# Patient Record
Sex: Male | Born: 1990 | Race: White | Hispanic: No | Marital: Married | Smoking: Never smoker
Health system: Southern US, Community
[De-identification: ages and names within clinical notes are randomized; demographics above are authoritative.]

## PROBLEM LIST (undated history)

## (undated) DIAGNOSIS — F419 Anxiety disorder, unspecified: Secondary | ICD-10-CM

## (undated) DIAGNOSIS — M109 Gout, unspecified: Secondary | ICD-10-CM

## (undated) HISTORY — DX: Anxiety disorder, unspecified: F41.9

## (undated) HISTORY — DX: Gout, unspecified: M10.9

---

## 2014-08-22 DIAGNOSIS — M109 Gout, unspecified: Secondary | ICD-10-CM | POA: Insufficient documentation

## 2015-01-14 ENCOUNTER — Other Ambulatory Visit: Payer: Self-pay | Admitting: Unknown Physician Specialty

## 2015-01-14 DIAGNOSIS — G4482 Headache associated with sexual activity: Secondary | ICD-10-CM

## 2015-01-23 ENCOUNTER — Ambulatory Visit
Admission: RE | Admit: 2015-01-23 | Discharge: 2015-01-23 | Disposition: A | Discharge status: Home / Self Care | Payer: BLUE CROSS/BLUE SHIELD | Source: Ambulatory Visit | Attending: Unknown Physician Specialty | Admitting: Unknown Physician Specialty

## 2015-01-23 DIAGNOSIS — G4482 Headache associated with sexual activity: Secondary | ICD-10-CM

## 2015-01-23 DIAGNOSIS — R51 Headache: Secondary | ICD-10-CM | POA: Diagnosis present

## 2015-01-23 DIAGNOSIS — G935 Compression of brain: Secondary | ICD-10-CM | POA: Insufficient documentation

## 2015-01-29 ENCOUNTER — Other Ambulatory Visit: Payer: Self-pay | Admitting: Unknown Physician Specialty

## 2015-01-29 DIAGNOSIS — G935 Compression of brain: Secondary | ICD-10-CM

## 2015-02-02 ENCOUNTER — Ambulatory Visit: Admission: RE | Admit: 2015-02-02 | Payer: BLUE CROSS/BLUE SHIELD | Source: Ambulatory Visit

## 2015-02-08 ENCOUNTER — Ambulatory Visit: Payer: BLUE CROSS/BLUE SHIELD

## 2015-02-14 ENCOUNTER — Ambulatory Visit
Admission: RE | Admit: 2015-02-14 | Discharge: 2015-02-14 | Disposition: A | Discharge status: Home / Self Care | Payer: BLUE CROSS/BLUE SHIELD | Source: Ambulatory Visit | Attending: Unknown Physician Specialty | Admitting: Unknown Physician Specialty

## 2015-02-14 DIAGNOSIS — M47892 Other spondylosis, cervical region: Secondary | ICD-10-CM | POA: Diagnosis not present

## 2015-02-14 DIAGNOSIS — G935 Compression of brain: Secondary | ICD-10-CM | POA: Diagnosis present

## 2015-02-27 ENCOUNTER — Other Ambulatory Visit: Payer: Self-pay | Admitting: Unknown Physician Specialty

## 2015-05-05 ENCOUNTER — Other Ambulatory Visit: Payer: Self-pay | Admitting: Unknown Physician Specialty

## 2015-07-07 ENCOUNTER — Other Ambulatory Visit: Payer: Self-pay | Admitting: Unknown Physician Specialty

## 2015-07-08 NOTE — Telephone Encounter (Signed)
Needs an appointment. Will get him enough medicine to make it to appointment when it's booked.   

## 2015-07-08 NOTE — Telephone Encounter (Signed)
Called and scheduled patient an appointment for 07/22/15.

## 2015-07-22 ENCOUNTER — Encounter: Payer: Self-pay | Admitting: Unknown Physician Specialty

## 2015-07-22 ENCOUNTER — Ambulatory Visit (INDEPENDENT_AMBULATORY_CARE_PROVIDER_SITE_OTHER): Payer: BLUE CROSS/BLUE SHIELD | Admitting: Unknown Physician Specialty

## 2015-07-22 VITALS — BP 142/86 | HR 63 | Temp 98.5°F | Ht 69.5 in | Wt 269.8 lb

## 2015-07-22 DIAGNOSIS — F419 Anxiety disorder, unspecified: Secondary | ICD-10-CM

## 2015-07-22 DIAGNOSIS — F322 Major depressive disorder, single episode, severe without psychotic features: Secondary | ICD-10-CM | POA: Diagnosis not present

## 2015-07-22 DIAGNOSIS — G935 Compression of brain: Secondary | ICD-10-CM | POA: Insufficient documentation

## 2015-07-22 NOTE — Assessment & Plan Note (Addendum)
Followed by neurosurgery.  MRI due this summer of C spine

## 2015-07-22 NOTE — Progress Notes (Signed)
BP 142/86 mmHg  Pulse 63  Temp(Src) 98.5 F (36.9 C)  Ht 5' 9.5" (1.765 m)  Wt 269 lb 12.8 oz (122.38 kg)  BMI 39.28 kg/m2  SpO2 100%   Subjective:    Patient ID: Kenneth Tate, male    DOB: [birth year], 24 y.o.   MRN: [id number]  HPI: Kenneth Tate is a 24 y.o. male  Chief Complaint  Patient presents with  . Medication Refill    pt states he needs refill on sertraline  . Anxiety   Depression/anxiety Stable.  Just got back from vacation.  Rarely takes Xanax.  He is considering cutting back Zoloft to 25 mg.   GAD 7 : Generalized Anxiety Score 07/22/2015  Nervous, Anxious, on Edge 0  Control/stop worrying 0  Worry too much - different things 0  Trouble relaxing 0  Restless 0  Easily annoyed or irritable 1  Afraid - awful might happen 1  Total GAD 7 Score 2   Depression screen PHQ 2/9 07/22/2015  Decreased Interest 0  Down, Depressed, Hopeless 1  PHQ - 2 Score 1    Relevant past medical, surgical, family and social history reviewed and updated as indicated. Interim medical history since our last visit reviewed. Allergies and medications reviewed and updated.  Review of Systems  Constitutional: Negative.   HENT: Negative.   Eyes: Negative.   Respiratory: Negative.   Cardiovascular: Negative.   Gastrointestinal: Negative.   Endocrine: Negative.   Genitourinary: Negative.   Skin: Negative.   Allergic/Immunologic: Negative.   Neurological: Negative.   Hematological: Negative.   Psychiatric/Behavioral: Negative.     Per HPI unless specifically indicated above     Objective:    BP 142/86 mmHg  Pulse 63  Temp(Src) 98.5 F (36.9 C)  Ht 5' 9.5" (1.765 m)  Wt 269 lb 12.8 oz (122.38 kg)  BMI 39.28 kg/m2  SpO2 100%  Wt Readings from Last 3 Encounters:  07/22/15 269 lb 12.8 oz (122.38 kg)  01/28/15 236 lb (107.049 kg)  02/14/15 236 lb (107.049 kg)    Physical Exam  Constitutional: He is oriented to person, place, and time. He appears  well-developed and well-nourished. No distress.  HENT:  Head: Normocephalic and atraumatic.  Eyes: Conjunctivae and lids are normal. Right eye exhibits no discharge. Left eye exhibits no discharge. No scleral icterus.  Cardiovascular: Normal rate, regular rhythm and normal heart sounds.   Pulmonary/Chest: Effort normal and breath sounds normal. No respiratory distress.  Abdominal: Normal appearance and bowel sounds are normal. He exhibits no distension. There is no splenomegaly or hepatomegaly. There is no tenderness.  Musculoskeletal: Normal range of motion.  Neurological: He is alert and oriented to person, place, and time.  Skin: Skin is intact. No rash noted. No pallor.  Psychiatric: He has a normal mood and affect. His behavior is normal. Judgment and thought content normal.    No results found for this or any previous visit.    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  MRI due this summer of C spine      Acute anxiety   Severe major depression (HCC)    Stable.  Cut back to 25 mg.  Pt has my permission to cut in half and increase back to 50 mg as needed.           Counseled  about borderline high BP.    Follow up plan: Return in about 6 months (around 01/19/2016) for physical.

## 2015-07-22 NOTE — Assessment & Plan Note (Signed)
Stable.  Cut back to 25 mg.  Pt has my permission to cut in half and increase back to 50 mg as needed.

## 2015-09-06 ENCOUNTER — Other Ambulatory Visit: Payer: Self-pay | Admitting: Family Medicine

## 2015-09-11 ENCOUNTER — Ambulatory Visit (INDEPENDENT_AMBULATORY_CARE_PROVIDER_SITE_OTHER): Payer: BLUE CROSS/BLUE SHIELD | Admitting: Family Medicine

## 2015-09-11 ENCOUNTER — Encounter: Payer: Self-pay | Admitting: Family Medicine

## 2015-09-11 VITALS — BP 139/89 | HR 81 | Temp 99.6°F | Ht 69.9 in | Wt 268.4 lb

## 2015-09-11 DIAGNOSIS — R52 Pain, unspecified: Secondary | ICD-10-CM

## 2015-09-11 DIAGNOSIS — J40 Bronchitis, not specified as acute or chronic: Secondary | ICD-10-CM

## 2015-09-11 LAB — INFLUENZA A AND B
INFLUENZA B AG, EIA: NEGATIVE
Influenza A Ag, EIA: NEGATIVE

## 2015-09-11 LAB — PLEASE NOTE:

## 2015-09-11 MED ORDER — ALBUTEROL SULFATE HFA 108 (90 BASE) MCG/ACT IN AERS: 2.0000 | INHALATION_SPRAY | Freq: Four times a day (QID) | RESPIRATORY_TRACT | Status: DC | PRN

## 2015-09-11 MED ORDER — BENZONATATE 200 MG PO CAPS: 200.0000 mg | ORAL_CAPSULE | Freq: Three times a day (TID) | ORAL | Status: DC | PRN

## 2015-09-11 MED ORDER — AZITHROMYCIN 250 MG PO TABS: ORAL_TABLET | ORAL | Status: DC

## 2015-09-11 MED ORDER — HYDROCOD POLST-CPM POLST ER 10-8 MG/5ML PO SUER: 5.0000 mL | Freq: Every evening | ORAL | Status: DC | PRN

## 2015-09-11 NOTE — Patient Instructions (Signed)

## 2015-09-11 NOTE — Progress Notes (Signed)
BP 139/89 mmHg  Pulse 81  Temp(Src) 99.6 F (37.6 C)  Ht 5' 9.9" (1.775 m)  Wt 268 lb 6.4 oz (121.745 kg)  BMI 38.64 kg/m2  SpO2 99%   Subjective:    Patient ID: Kenneth Tate, male    DOB: 12/02/1990, 24 y.o.   MRN: [id number]  HPI: Kenneth Tate is a 24 y.o. male  Chief Complaint  Patient presents with  . URI    pt states he has some chest congestion, body aches (head feels heavy), cough, chills, and states fever spikes at night (sweats at night). States symptoms started 09/07/15.   UPPER RESPIRATORY TRACT INFECTION- spot on his mouth that is inflammed that started before the URI Duration: 3-4 days Worst symptom: Headache Fever: yes 100-101 Cough: yes Shortness of breath: yes Wheezing: yes Chest pain: no Chest tightness: yes Chest congestion: no Nasal congestion: no Runny nose: no Post nasal drip: yes Sneezing: no Sore throat: yes Swollen glands: no Sinus pressure: yes Headache: yes Face pain: yes Toothache: yes Ear pain: yes bilateral Ear pressure: yes bilateral Eyes red/itching:no Eye drainage/crusting: no  Vomiting: no Rash: no Fatigue: yes Sick contacts: yes Strep contacts: no  Context: stable Recurrent sinusitis: no Relief with OTC cold/cough medications: no  Treatments attempted: ibuprofen, cold/sinus and cough syrup    Relevant past medical, surgical, family and social history reviewed and updated as indicated. Interim medical history since our last visit reviewed. Allergies and medications reviewed and updated.  Review of Systems  Constitutional: Negative.   HENT: Negative.   Respiratory: Negative.   Cardiovascular: Negative.   Psychiatric/Behavioral: Negative.     Per HPI unless specifically indicated above     Objective:    BP 139/89 mmHg  Pulse 81  Temp(Src) 99.6 F (37.6 C)  Ht 5' 9.9" (1.775 m)  Wt 268 lb 6.4 oz (121.745 kg)  BMI 38.64 kg/m2  SpO2 99%  Wt Readings from Last 3 Encounters:  09/11/15 268 lb 6.4 oz  (121.745 kg)  07/22/15 269 lb 12.8 oz (122.38 kg)  01/28/15 236 lb (107.049 kg)    Physical Exam  Constitutional: He is oriented to person, place, and time. He appears well-developed and well-nourished. No distress.  HENT:  Head: Normocephalic and atraumatic.  Right Ear: Hearing normal.  Left Ear: Hearing normal.  Nose: Nose normal.  Eyes: Conjunctivae and lids are normal. Right eye exhibits no discharge. Left eye exhibits no discharge. No scleral icterus.  Cardiovascular: Normal rate, regular rhythm, normal heart sounds and intact distal pulses.  Exam reveals no gallop and no friction rub.   No murmur heard. Pulmonary/Chest: Effort normal. No respiratory distress. He has wheezes. He has no rales. He exhibits no tenderness.  Musculoskeletal: Normal range of motion.  Neurological: He is alert and oriented to person, place, and time.  Skin: Skin is warm, dry and intact. No rash noted. No erythema. No pallor.  Psychiatric: He has a normal mood and affect. His speech is normal and behavior is normal. Judgment and thought content normal. Cognition and memory are normal.  Nursing note and vitals reviewed.   No results found for this or any previous visit.    Assessment & Plan:   Problem List Items Addressed This Visit    None    Visit Diagnoses    Bronchitis    -  Primary    Will treat with albuterol and z-pack. Tussinoex and tessalon perles for cough. Return for lung recheck in 2 weeks. Rest and fluids.  Body aches        Checking for flu- negative.     Relevant Orders    Influenza a and b        Follow up plan: Return in about 2 weeks (around 09/25/2015) for Lung recheck.

## 2015-11-07 ENCOUNTER — Ambulatory Visit (INDEPENDENT_AMBULATORY_CARE_PROVIDER_SITE_OTHER): Payer: BLUE CROSS/BLUE SHIELD | Admitting: Family Medicine

## 2015-11-07 ENCOUNTER — Other Ambulatory Visit: Payer: Self-pay | Admitting: Family Medicine

## 2015-11-07 ENCOUNTER — Encounter: Payer: Self-pay | Admitting: Family Medicine

## 2015-11-07 VITALS — BP 138/89 | HR 69 | Temp 98.4°F | Ht 69.3 in | Wt 266.0 lb

## 2015-11-07 DIAGNOSIS — K648 Other hemorrhoids: Secondary | ICD-10-CM | POA: Diagnosis not present

## 2015-11-07 DIAGNOSIS — K644 Residual hemorrhoidal skin tags: Secondary | ICD-10-CM

## 2015-11-07 DIAGNOSIS — K921 Melena: Secondary | ICD-10-CM

## 2015-11-07 LAB — CBC WITH DIFFERENTIAL/PLATELET
HEMATOCRIT: 42 % (ref 37.5–51.0)
Hemoglobin: 14.7 g/dL (ref 12.6–17.7)
LYMPHS: 33 %
Lymphocytes Absolute: 2.5 10*3/uL (ref 0.7–3.1)
MCH: 30.6 pg (ref 26.6–33.0)
MCHC: 35 g/dL (ref 31.5–35.7)
MCV: 87 fL (ref 79–97)
MID (Absolute): 0.3 10*3/uL (ref 0.1–1.6)
MID: 4 %
NEUTROS PCT: 63 %
Neutrophils Absolute: 5 10*3/uL (ref 1.4–7.0)
PLATELETS: 324 10*3/uL (ref 150–379)
RBC: 4.81 x10E6/uL (ref 4.14–5.80)
RDW: 12.7 % (ref 12.3–15.4)
WBC: 7.8 10*3/uL (ref 3.4–10.8)

## 2015-11-07 MED ORDER — HYDROCORTISONE ACETATE 25 MG RE SUPP: 25.0000 mg | Freq: Two times a day (BID) | RECTAL | Status: DC

## 2015-11-07 NOTE — Progress Notes (Signed)
BP 138/89 mmHg  Pulse 69  Temp(Src) 98.4 F (36.9 C)  Ht 5' 9.3" (1.76 m)  Wt 266 lb (120.657 kg)  BMI 38.95 kg/m2  SpO2 99%   Subjective:    Patient ID: Kenneth Tate, male    DOB: Sep 24, 1990, 25 y.o.   MRN: [id number]  HPI: Kenneth Tate is a 25 y.o. male  Chief Complaint  Patient presents with  . Rectal Bleeding    Patient states that he has bleeding after exercise. Light red, itching and burning.   RECTAL BLEEDING- only after he exercises. Has some blood in his underwear, never with a bowel movement. With sweating and exercise Duration: month Bright red rectal bleeding:  yes Amount of blood: spotting Frequency: once a week Melena:  no Spotting on toilet tissue:  yes Anal fullness:  no Perianal pain:  yes Severity: mild Perianal irritation/itching:  yes Constipation:  no Chronic straining/valsava:  no Anal trauma/intercourse:  no Hemorrhoids:  no Previous colonoscopy:  no  Relevant past medical, surgical, family and social history reviewed and updated as indicated. Interim medical history since our last visit reviewed. Allergies and medications reviewed and updated.  Review of Systems  Constitutional: Negative.   Respiratory: Negative.   Cardiovascular: Negative.   Gastrointestinal: Negative.   Psychiatric/Behavioral: Negative for suicidal ideas, hallucinations, behavioral problems, confusion, sleep disturbance, self-injury, dysphoric mood, decreased concentration and agitation. The patient is nervous/anxious. The patient is not hyperactive.     Per HPI unless specifically indicated above     Objective:    BP 138/89 mmHg  Pulse 69  Temp(Src) 98.4 F (36.9 C)  Ht 5' 9.3" (1.76 m)  Wt 266 lb (120.657 kg)  BMI 38.95 kg/m2  SpO2 99%  Wt Readings from Last 3 Encounters:  11/07/15 266 lb (120.657 kg)  09/11/15 268 lb 6.4 oz (121.745 kg)  07/22/15 269 lb 12.8 oz (122.38 kg)    Physical Exam  Constitutional: He is oriented to person, place, and  time. He appears well-developed and well-nourished. No distress.  HENT:  Head: Normocephalic and atraumatic.  Right Ear: Hearing normal.  Left Ear: Hearing normal.  Nose: Nose normal.  Eyes: Conjunctivae and lids are normal. Right eye exhibits no discharge. Left eye exhibits no discharge. No scleral icterus.  Pulmonary/Chest: Effort normal. No respiratory distress.  Genitourinary: Rectal exam shows external hemorrhoid.     Musculoskeletal: Normal range of motion.  Neurological: He is alert and oriented to person, place, and time.  Skin: Skin is intact. No rash noted.  Psychiatric: He has a normal mood and affect. His speech is normal and behavior is normal. Judgment and thought content normal. Cognition and memory are normal.    Results for orders placed or performed in visit on 09/11/15  Influenza a and b  Result Value Ref Range   Influenza A Ag, EIA Negative Negative   Influenza B Ag, EIA Negative Negative   Influenza Comment See note   Please note:  Result Value Ref Range   Please note: Comment       Assessment & Plan:   Problem List Items Addressed This Visit    None    Visit Diagnoses    External hemorrhoid    -  Primary    Will treat with suppository, information given. Continue to monitor. Call with any problems.    Blood in stool        Normal CBC today. Likely due to hemorrhoid.        Follow up  plan: Return if symptoms worsen or fail to improve.

## 2015-11-07 NOTE — Patient Instructions (Signed)

## 2015-12-04 ENCOUNTER — Other Ambulatory Visit: Payer: Self-pay | Admitting: Family Medicine

## 2016-01-20 ENCOUNTER — Ambulatory Visit (INDEPENDENT_AMBULATORY_CARE_PROVIDER_SITE_OTHER): Payer: BLUE CROSS/BLUE SHIELD | Admitting: Family Medicine

## 2016-01-20 ENCOUNTER — Encounter: Payer: Self-pay | Admitting: Family Medicine

## 2016-01-20 VITALS — BP 143/77 | HR 59 | Temp 98.2°F

## 2016-01-20 DIAGNOSIS — F322 Major depressive disorder, single episode, severe without psychotic features: Secondary | ICD-10-CM | POA: Diagnosis not present

## 2016-01-20 DIAGNOSIS — Z0000 Encounter for general adult medical examination without abnormal findings: Secondary | ICD-10-CM

## 2016-01-20 LAB — URINALYSIS, ROUTINE W REFLEX MICROSCOPIC
Bilirubin, UA: NEGATIVE
Glucose, UA: NEGATIVE
KETONES UA: NEGATIVE
LEUKOCYTES UA: NEGATIVE
NITRITE UA: NEGATIVE
PROTEIN UA: NEGATIVE
RBC UA: NEGATIVE
Urobilinogen, Ur: 0.2 mg/dL (ref 0.2–1.0)
pH, UA: 7 (ref 5.0–7.5)

## 2016-01-20 MED ORDER — SERTRALINE HCL 50 MG PO TABS: 50.0000 mg | ORAL_TABLET | Freq: Every day | ORAL | Status: DC

## 2016-01-20 NOTE — Addendum Note (Signed)
Addended by: Bennetta LaosWILSON, Enjoli Tidd H on: 01/20/2016 10:24 AM   Modules accepted: Kipp BroodSmartSet

## 2016-01-20 NOTE — Assessment & Plan Note (Signed)
Panic stable wants to try and stop zoloft which is OK If needs more xanax will consider other meds

## 2016-01-20 NOTE — Progress Notes (Signed)
BP 143/77 mmHg  Pulse 59  Temp(Src) 98.2 F (36.8 C)  SpO2 99%   Subjective:    Patient ID: Kenneth Tate, male    DOB: October 24, 1990, 25 y.o.   MRN: [id number]  HPI: Kenneth SilviusZachary J Grindstaff is a 25 y.o. male  Chief Complaint  Patient presents with  . Annual Exam   Patient all in all doing well wakes up sometimes with hands numb shakes his hands is able to go right back to sleep with no further hand numbness. Is only when he wakes up in the morning doesn't bother  at night sleeping. 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  Relevant past medical, surgical, family and social history reviewed and updated as indicated. Interim medical history since our last visit reviewed. Allergies and medications reviewed and updated.  Review of Systems  Constitutional: Negative.   HENT: Negative.   Eyes: Negative.   Respiratory: Negative.   Cardiovascular: Negative.   Gastrointestinal: Negative.   Endocrine: Negative.   Genitourinary: Negative.   Musculoskeletal: Negative.   Skin: Negative.   Allergic/Immunologic: Negative.   Neurological: Negative.   Hematological: Negative.   Psychiatric/Behavioral: Negative.     Per HPI unless specifically indicated above     Objective:    BP 143/77 mmHg  Pulse 59  Temp(Src) 98.2 F (36.8 C)  SpO2 99%  Wt Readings from Last 3 Encounters:  11/07/15 266 lb (120.657 kg)  09/11/15 268 lb 6.4 oz (121.745 kg)  07/22/15 269 lb 12.8 oz (122.38 kg)    Physical Exam  Constitutional: He is oriented to person, place, and time. He appears well-developed and well-nourished.  HENT:  Head: Normocephalic.  Right Ear: External ear normal.  Left Ear: External ear normal.  Nose: Nose normal.  Eyes: Conjunctivae and EOM are normal. Pupils are equal, round, and reactive to light.  Neck: Normal range of  motion. Neck supple. No thyromegaly present.  Cardiovascular: Normal rate, regular rhythm, normal heart sounds and intact distal pulses.   Pulmonary/Chest: Effort normal and breath sounds normal.  Abdominal: Soft. Bowel sounds are normal. There is no splenomegaly or hepatomegaly.  Genitourinary: Penis normal.  Musculoskeletal: Normal range of motion.  Lymphadenopathy:    He has no cervical adenopathy.  Neurological: He is alert and oriented to person, place, and time. He has normal reflexes.  Skin: Skin is warm and dry.  Psychiatric: He has a normal mood and affect. His behavior is normal. Judgment and thought content normal.    Results for orders placed or performed in visit on 11/07/15  CBC With Differential/Platelet  Result Value Ref Range   WBC 7.8 3.4 - 10.8 x10E3/uL   RBC 4.81 4.14 - 5.80 x10E6/uL   Hemoglobin 14.7 12.6 - 17.7 g/dL   Hematocrit 47.442.0 25.937.5 - 51.0 %   MCV 87 79 - 97 fL   MCH 30.6 26.6 - 33.0 pg   MCHC 35.0 31.5 - 35.7 g/dL   RDW 56.312.7 87.512.3 - 64.315.4 %   Platelets 324 150 - 379 x10E3/uL   Neutrophils 63 %   Lymphs 33 %   MID 4 %   Neutrophils Absolute 5.0 1.4 - 7.0 x10E3/uL   Lymphocytes Absolute 2.5 0.7 - 3.1 x10E3/uL   MID (Absolute) 0.3 0.1 - 1.6 X10E3/uL      Assessment & Plan:   Problem  List Items Addressed This Visit      Other   Severe major depression (HCC)    Panic stable wants to try and stop zoloft which is OK If needs more xanax will consider other meds      Relevant Medications   sertraline (ZOLOFT) 50 MG tablet    Other Visit Diagnoses    Routine general medical examination at a health care facility    -  Primary    Relevant Orders    CBC with Differential/Platelet    Comprehensive metabolic panel    Lipid Panel w/o Chol/HDL Ratio    TSH    Urinalysis, Routine w reflex microscopic (not at [facility])      For elevated blood pressure today patient will work on diet exercise nutrition weight loss Checking blood pressure if not coming down  we will reevaluate   Follow up plan: Return in about 6 months (around 07/22/2016) for Med check and blood pressure check.

## 2016-01-21 ENCOUNTER — Encounter: Payer: Self-pay | Admitting: Family Medicine

## 2016-01-21 LAB — COMPREHENSIVE METABOLIC PANEL
A/G RATIO: 1.5 (ref 1.2–2.2)
ALK PHOS: 68 IU/L (ref 39–117)
ALT: 15 IU/L (ref 0–44)
AST: 22 IU/L (ref 0–40)
Albumin: 4.5 g/dL (ref 3.5–5.5)
BUN / CREAT RATIO: 10 (ref 9–20)
BUN: 9 mg/dL (ref 6–20)
Bilirubin Total: 0.6 mg/dL (ref 0.0–1.2)
CALCIUM: 9.5 mg/dL (ref 8.7–10.2)
CHLORIDE: 101 mmol/L (ref 96–106)
CO2: 25 mmol/L (ref 18–29)
CREATININE: 0.87 mg/dL (ref 0.76–1.27)
GFR calc Af Amer: 137 mL/min/{1.73_m2} (ref >59)
GFR calc non Af Amer: 118 mL/min/{1.73_m2} (ref >59)
GLOBULIN, TOTAL: 3 g/dL (ref 1.5–4.5)
Glucose: 98 mg/dL (ref 65–99)
POTASSIUM: 4.8 mmol/L (ref 3.5–5.2)
SODIUM: 141 mmol/L (ref 134–144)
Total Protein: 7.5 g/dL (ref 6.0–8.5)

## 2016-01-21 LAB — CBC WITH DIFFERENTIAL/PLATELET
Basophils Absolute: 0 10*3/uL (ref 0.0–0.2)
Basos: 0 %
EOS (ABSOLUTE): 0.1 10*3/uL (ref 0.0–0.4)
Eos: 2 %
Hematocrit: 43.4 % (ref 37.5–51.0)
Hemoglobin: 14.5 g/dL (ref 12.6–17.7)
IMMATURE GRANULOCYTES: 0 %
Immature Grans (Abs): 0 10*3/uL (ref 0.0–0.1)
LYMPHS ABS: 2 10*3/uL (ref 0.7–3.1)
Lymphs: 26 %
MCH: 29.9 pg (ref 26.6–33.0)
MCHC: 33.4 g/dL (ref 31.5–35.7)
MCV: 90 fL (ref 79–97)
MONOS ABS: 0.5 10*3/uL (ref 0.1–0.9)
Monocytes: 7 %
NEUTROS PCT: 65 %
Neutrophils Absolute: 5 10*3/uL (ref 1.4–7.0)
PLATELETS: 302 10*3/uL (ref 150–379)
RBC: 4.85 x10E6/uL (ref 4.14–5.80)
RDW: 13.1 % (ref 12.3–15.4)
WBC: 7.6 10*3/uL (ref 3.4–10.8)

## 2016-01-21 LAB — LIPID PANEL W/O CHOL/HDL RATIO
CHOLESTEROL TOTAL: 228 mg/dL — AB (ref 100–199)
HDL: 43 mg/dL (ref >39)
LDL CALC: 155 mg/dL — AB (ref 0–99)
Triglycerides: 149 mg/dL (ref 0–149)
VLDL Cholesterol Cal: 30 mg/dL (ref 5–40)

## 2016-01-21 LAB — TSH: TSH: 1.12 u[IU]/mL (ref 0.450–4.500)

## 2016-03-30 ENCOUNTER — Other Ambulatory Visit: Payer: Self-pay | Admitting: Family Medicine

## 2016-03-30 MED ORDER — ALPRAZOLAM 0.25 MG PO TABS: 0.2500 mg | ORAL_TABLET | Freq: Every evening | ORAL | Status: AC | PRN

## 2016-03-30 NOTE — Telephone Encounter (Signed)
Routing to provider.   Last Visit 01/20/2016 Upcoming appointment 07/20/2016

## 2016-03-30 NOTE — Telephone Encounter (Signed)
Pt called and stated that he would like a refill on ALPRAZolam (XANAX) 0.25 MG tablet. He says he only uses this when he travels and would like to know if he could have it for his flight Wednesday.

## 2016-04-12 IMAGING — MR MR HEAD W/O CM
11 series · 41 of 48 positions shown · non-contrast
Comparison: None.

CLINICAL DATA: Posterior headaches with strenuous activity.
Symptoms present for 2 weeks.

EXAM:
MRI HEAD WITHOUT CONTRAST
MRA HEAD WITHOUT CONTRAST
TECHNIQUE: Multiplanar, multiecho pulse sequences of the brain and surrounding
structures were obtained without intravenous contrast. Angiographic
images of the head were obtained using MRA technique without
contrast.

[Series 4: DWI · axial · 3.0mm · 1.20mm/px · z∈[-15,+166]mm · 5 of 65 slices shown (1 of 4)]
[im 1/65]
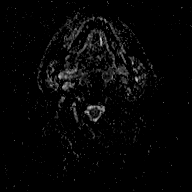
[im 17/65]
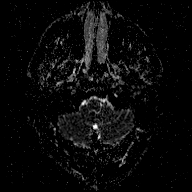
[im 33/65]
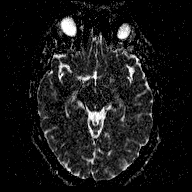
[im 49/65]
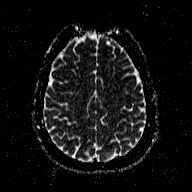
[im 65/65]
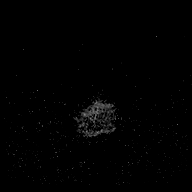

[Series 5: DWI · coronal · 3.0mm · 1.20mm/px · 7 of 96 slices shown (2 of 4)]
[im 1/96]
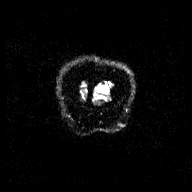
[im 16/96]
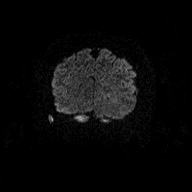
[im 32/96]
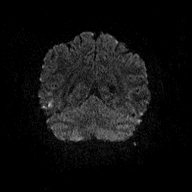
[im 48/96]
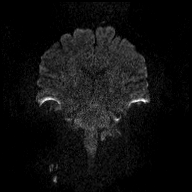
[im 64/96]
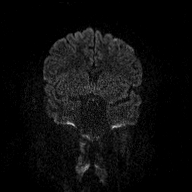
[im 80/96]
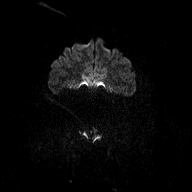
[im 96/96]
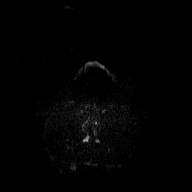

[Series 7: TOF · axial · non-contrast · 0.5mm · 0.35mm/px · z∈[+18,+94]mm · 7 of 188 slices shown]
[im 1/188]
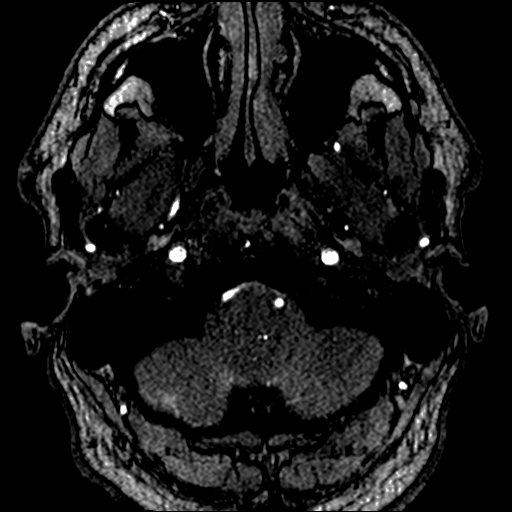
[im 29/188]
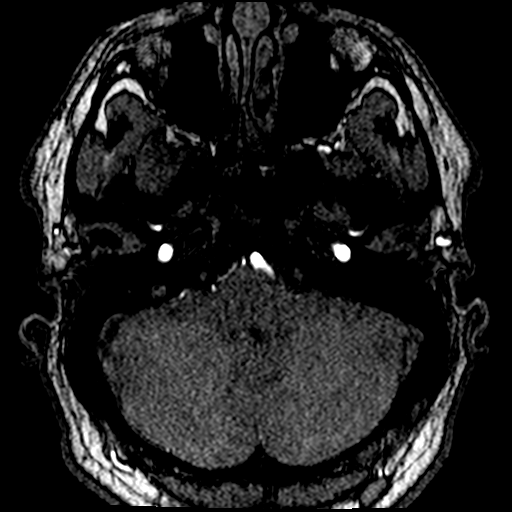
[im 58/188]
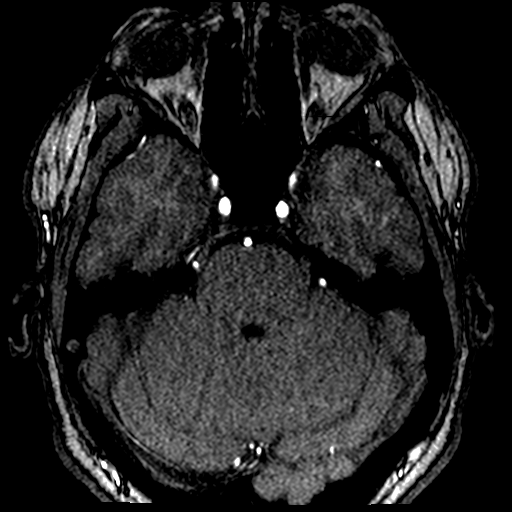
[im 87/188]
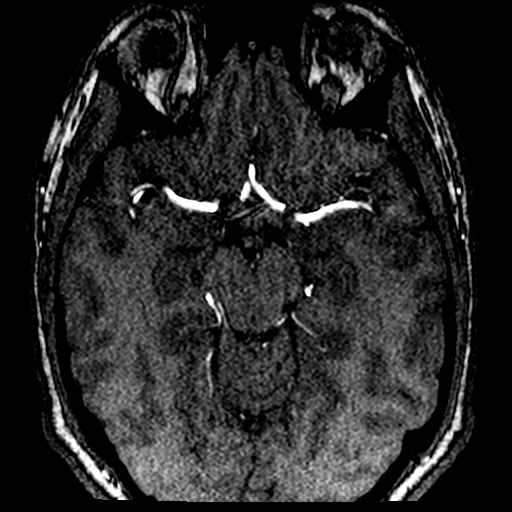
[im 101/188]
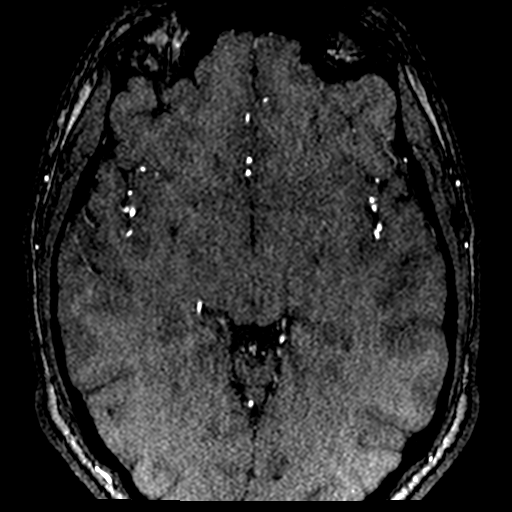
[im 130/188]
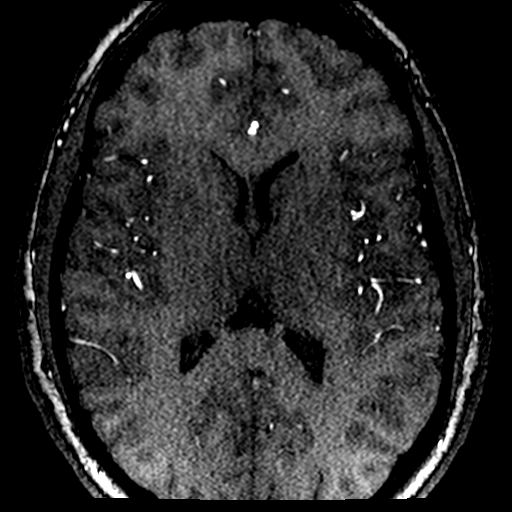
[im 159/188]
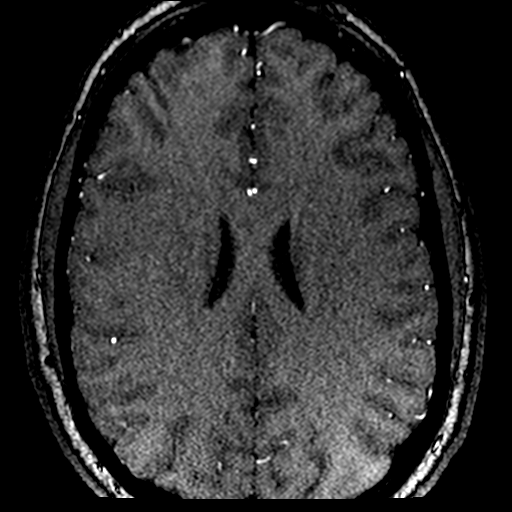

[Series 11: T1 · sagittal · 5.0mm · 0.47mm/px · 2 of 23 slices shown (1 of 2)]
[im 1/23]
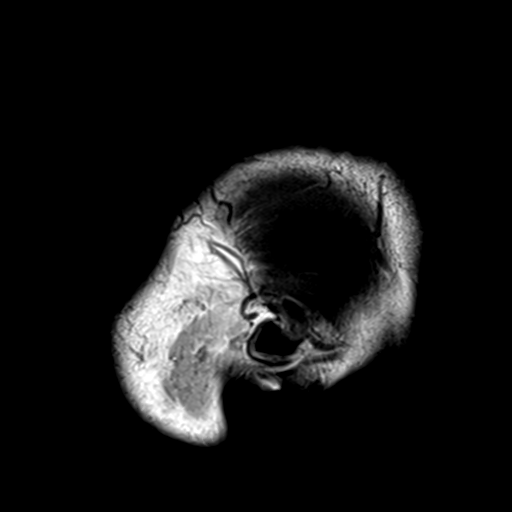
[im 23/23]
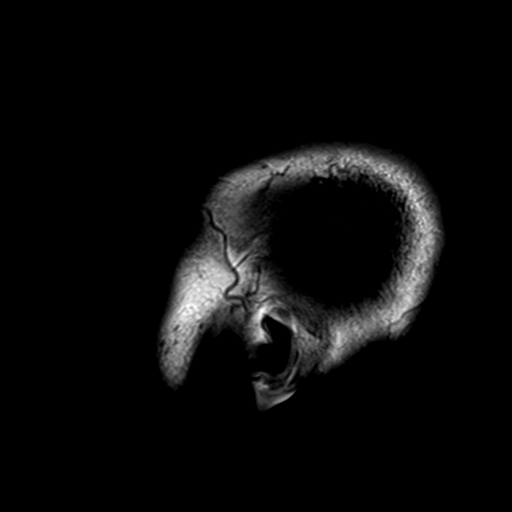

[Series 13: T2 · axial · 5.0mm · 0.72mm/px · z∈[-2,+152]mm · 2 of 26 slices shown (1 of 2)]
[im 1/26]
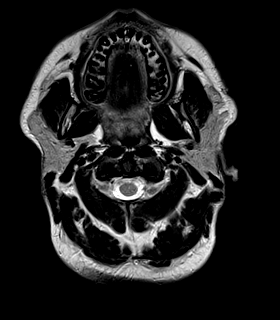
[im 26/26]
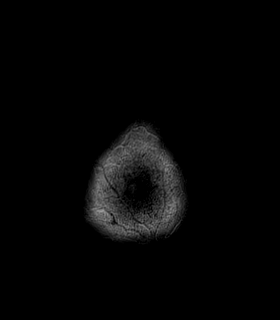

[Series 14: FLAIR · axial · 5.0mm · 0.45mm/px · z∈[-2,+152]mm · 2 of 26 slices shown]
[im 1/26]
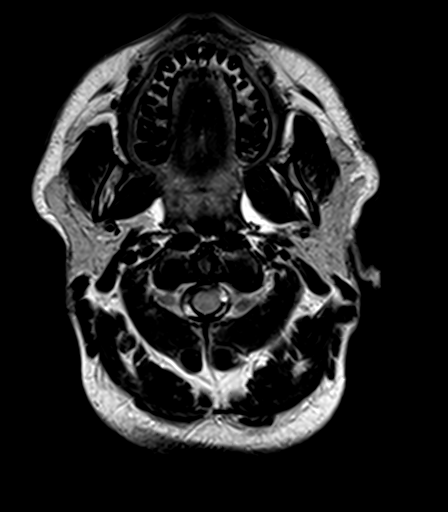
[im 26/26]
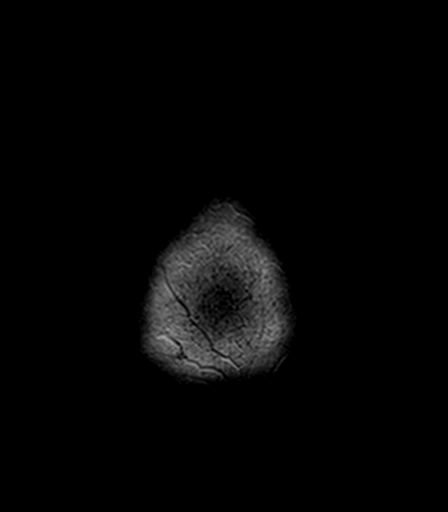

[Series 15: T2 · axial · 5.0mm · 0.72mm/px · z∈[-2,+152]mm · 2 of 26 slices shown (2 of 2)]
[im 1/26]
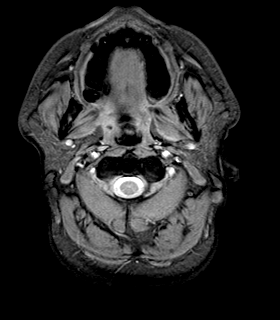
[im 26/26]
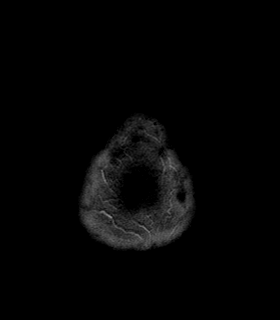

[Series 16: T1 · axial · 3.0mm · 1.00mm/px · z∈[+1,+157]mm · 4 of 56 slices shown (2 of 2)]
[im 1/56]
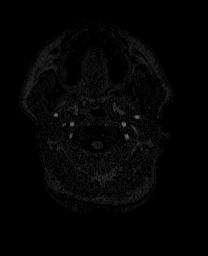
[im 19/56]
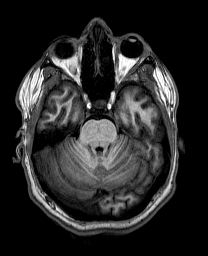
[im 37/56]
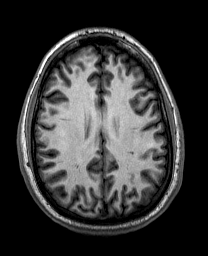
[im 56/56]
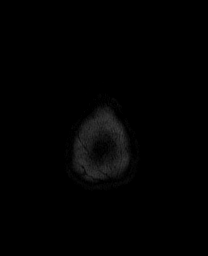

[Series 17: T1 post-contrast · coronal · 5.0mm · 0.45mm/px · 2 of 29 slices shown]
[im 1/29]
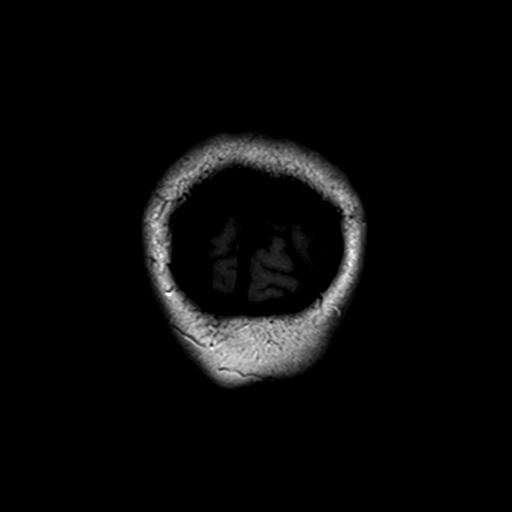
[im 29/29]
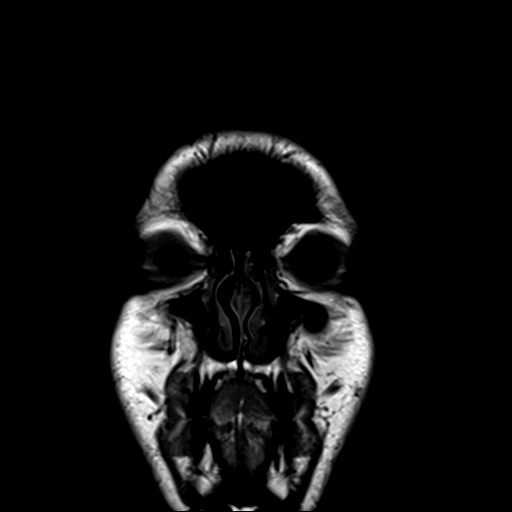

[Series 100: DWI · axial · 3.0mm · 1.20mm/px · z∈[-15,+166]mm · 5 of 65 slices shown (3 of 4)]
[im 1/65]
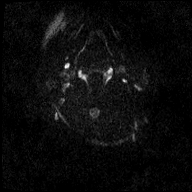
[im 17/65]
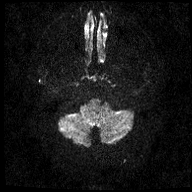
[im 33/65]
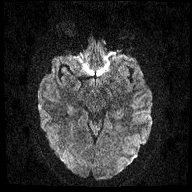
[im 49/65]
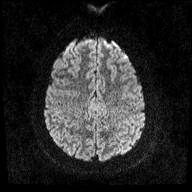
[im 65/65]
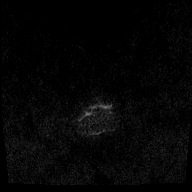

[Series 101: DWI · coronal · 3.0mm · 1.20mm/px · 3 of 47 slices shown (4 of 4)]
[im 1/47]
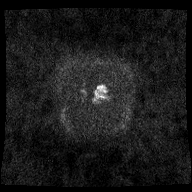
[im 24/47]
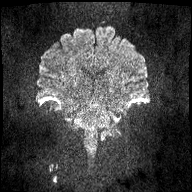
[im 47/47]
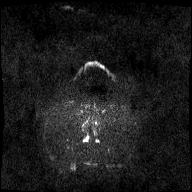

[41 of 48 positions shown; findings below may reference images not displayed]

FINDINGS: MRI HEAD FINDINGS

Calvarium and upper cervical spine: No marrow signal abnormality.

Orbits: No significant findings.

Sinuses: Clear. Mastoid and middle ears are clear.

Brain: The cerebellar tonsils project 8 mm below the foramen magnum
and have a mildly pointed morphology. There is foramen magnum
crowding. No evidence of syrinx. No hydrocephalus. No acute or
remote infarct, hemorrhage, or mass lesion. No evidence of large
vessel occlusion.

MRA HEAD FINDINGS

Present anterior communicating artery. No visible posterior
communicating arteries. Due to coverage, the PICA origins were not
visualized. No aneurysm, stenosis, branch occlusion, or evidence of
vascular malformation.
IMPRESSION: 1. In the appropriate clinical setting, findings consistent with
Chiari 1 malformation.
2. Negative MRA. The PICA origins were not visualized due to
coverage; if finding #1 does not explain patient symptoms, would
gladly repeat the MRA.

## 2016-07-20 ENCOUNTER — Ambulatory Visit: Payer: Self-pay | Admitting: Family Medicine

## 2017-02-13 ENCOUNTER — Other Ambulatory Visit: Payer: Self-pay | Admitting: Family Medicine

## 2017-02-15 NOTE — Telephone Encounter (Signed)
Last (acute) OV:  Last routine OV: 01/20/16 Next OV: None of file.
